# Patient Record
Sex: Female | Born: 1995 | Hispanic: No | Marital: Single | Smoking: Never smoker
Health system: Southern US, Community
[De-identification: ages and names within clinical notes are randomized; demographics above are authoritative.]

## PROBLEM LIST (undated history)

## (undated) ENCOUNTER — Emergency Department (HOSPITAL_COMMUNITY): Admission: EM | Payer: 59 | Source: Home / Self Care

## (undated) ENCOUNTER — Ambulatory Visit: Admission: EM

## (undated) DIAGNOSIS — D649 Anemia, unspecified: Secondary | ICD-10-CM

## (undated) DIAGNOSIS — J45909 Unspecified asthma, uncomplicated: Secondary | ICD-10-CM

## (undated) DIAGNOSIS — F419 Anxiety disorder, unspecified: Secondary | ICD-10-CM

## (undated) DIAGNOSIS — F32A Depression, unspecified: Secondary | ICD-10-CM

## (undated) DIAGNOSIS — F909 Attention-deficit hyperactivity disorder, unspecified type: Secondary | ICD-10-CM

## (undated) HISTORY — DX: Unspecified asthma, uncomplicated: J45.909

## (undated) HISTORY — DX: Anemia, unspecified: D64.9

## (undated) HISTORY — DX: Depression, unspecified: F32.A

## (undated) HISTORY — DX: Attention-deficit hyperactivity disorder, unspecified type: F90.9

## (undated) HISTORY — DX: Anxiety disorder, unspecified: F41.9

---
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
## 2017-01-12 ENCOUNTER — Ambulatory Visit (INDEPENDENT_AMBULATORY_CARE_PROVIDER_SITE_OTHER): Payer: BC Managed Care – PPO | Admitting: Psychology

## 2017-01-12 DIAGNOSIS — F3341 Major depressive disorder, recurrent, in partial remission: Secondary | ICD-10-CM

## 2017-02-09 ENCOUNTER — Ambulatory Visit (INDEPENDENT_AMBULATORY_CARE_PROVIDER_SITE_OTHER): Payer: BC Managed Care – PPO | Admitting: Psychology

## 2017-02-09 DIAGNOSIS — F3341 Major depressive disorder, recurrent, in partial remission: Secondary | ICD-10-CM | POA: Diagnosis not present

## 2017-02-23 ENCOUNTER — Ambulatory Visit (INDEPENDENT_AMBULATORY_CARE_PROVIDER_SITE_OTHER): Payer: BC Managed Care – PPO | Admitting: Psychology

## 2017-02-23 DIAGNOSIS — F331 Major depressive disorder, recurrent, moderate: Secondary | ICD-10-CM

## 2017-03-09 ENCOUNTER — Ambulatory Visit (INDEPENDENT_AMBULATORY_CARE_PROVIDER_SITE_OTHER): Payer: BC Managed Care – PPO | Admitting: Psychology

## 2017-03-09 DIAGNOSIS — F320 Major depressive disorder, single episode, mild: Secondary | ICD-10-CM

## 2017-03-23 ENCOUNTER — Ambulatory Visit (INDEPENDENT_AMBULATORY_CARE_PROVIDER_SITE_OTHER): Payer: BC Managed Care – PPO | Admitting: Psychology

## 2017-03-23 DIAGNOSIS — F321 Major depressive disorder, single episode, moderate: Secondary | ICD-10-CM | POA: Diagnosis not present

## 2017-04-06 ENCOUNTER — Ambulatory Visit (INDEPENDENT_AMBULATORY_CARE_PROVIDER_SITE_OTHER): Payer: BC Managed Care – PPO | Admitting: Psychology

## 2017-04-06 DIAGNOSIS — F320 Major depressive disorder, single episode, mild: Secondary | ICD-10-CM | POA: Diagnosis not present

## 2017-05-04 ENCOUNTER — Ambulatory Visit: Payer: BC Managed Care – PPO | Admitting: Psychology

## 2017-05-18 ENCOUNTER — Ambulatory Visit: Payer: BC Managed Care – PPO | Admitting: Psychology

## 2017-06-01 ENCOUNTER — Ambulatory Visit: Payer: Self-pay | Admitting: Psychology

## 2017-06-15 ENCOUNTER — Ambulatory Visit: Payer: Self-pay | Admitting: Psychology

## 2017-06-16 ENCOUNTER — Ambulatory Visit: Payer: BC Managed Care – PPO | Admitting: Psychology

## 2017-06-16 DIAGNOSIS — F331 Major depressive disorder, recurrent, moderate: Secondary | ICD-10-CM

## 2017-06-23 ENCOUNTER — Ambulatory Visit: Payer: BC Managed Care – PPO | Admitting: Psychology

## 2017-06-29 ENCOUNTER — Ambulatory Visit: Payer: Self-pay | Admitting: Psychology

## 2017-07-27 ENCOUNTER — Ambulatory Visit: Payer: Self-pay | Admitting: Psychology

## 2017-08-10 ENCOUNTER — Ambulatory Visit: Payer: Self-pay | Admitting: Psychology

## 2017-08-24 ENCOUNTER — Ambulatory Visit: Payer: Self-pay | Admitting: Psychology

## 2017-09-07 ENCOUNTER — Ambulatory Visit: Payer: Self-pay | Admitting: Psychology

## 2017-09-21 ENCOUNTER — Ambulatory Visit: Payer: Self-pay | Admitting: Psychology

## 2017-10-05 ENCOUNTER — Ambulatory Visit: Payer: Self-pay | Admitting: Psychology

## 2017-10-19 ENCOUNTER — Ambulatory Visit: Payer: Self-pay | Admitting: Psychology

## 2018-08-09 ENCOUNTER — Ambulatory Visit (HOSPITAL_COMMUNITY)
Admission: EM | Admit: 2018-08-09 | Discharge: 2018-08-09 | Disposition: A | Discharge status: Home / Self Care | Payer: BC Managed Care – PPO | Attending: Emergency Medicine | Admitting: Emergency Medicine

## 2018-08-09 ENCOUNTER — Encounter (HOSPITAL_COMMUNITY): Payer: Self-pay | Admitting: Emergency Medicine

## 2018-08-09 ENCOUNTER — Ambulatory Visit (INDEPENDENT_AMBULATORY_CARE_PROVIDER_SITE_OTHER): Payer: BC Managed Care – PPO

## 2018-08-09 DIAGNOSIS — M25562 Pain in left knee: Secondary | ICD-10-CM

## 2018-08-09 DIAGNOSIS — S8002XA Contusion of left knee, initial encounter: Secondary | ICD-10-CM

## 2018-08-09 MED ORDER — NAPROXEN 500 MG PO TABS: 500.0000 mg | ORAL_TABLET | Freq: Two times a day (BID) | ORAL | 0 refills | Status: DC
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
## 2018-08-09 NOTE — Discharge Instructions (Signed)
ice/ rest  Will need to see ortho if pain not better in 1 week
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
## 2018-08-11 ENCOUNTER — Ambulatory Visit (HOSPITAL_COMMUNITY)
Admission: EM | Admit: 2018-08-11 | Discharge: 2018-08-11 | Disposition: A | Discharge status: Home / Self Care | Payer: BC Managed Care – PPO | Attending: Family Medicine | Admitting: Family Medicine

## 2018-08-11 ENCOUNTER — Encounter (HOSPITAL_COMMUNITY): Payer: Self-pay | Admitting: Family Medicine

## 2018-08-11 DIAGNOSIS — M25562 Pain in left knee: Secondary | ICD-10-CM | POA: Diagnosis not present

## 2018-08-11 DIAGNOSIS — S161XXA Strain of muscle, fascia and tendon at neck level, initial encounter: Secondary | ICD-10-CM | POA: Diagnosis not present

## 2018-08-11 DIAGNOSIS — S8002XA Contusion of left knee, initial encounter: Secondary | ICD-10-CM

## 2018-08-11 MED ORDER — MELOXICAM 15 MG PO TABS: 15.0000 mg | ORAL_TABLET | Freq: Every day | ORAL | 1 refills | Status: DC
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
## 2018-08-11 NOTE — Discharge Instructions (Signed)
You have been referred to Dr. Teryl Lucy for follow-up on the knee contusion

## 2019-02-02 ENCOUNTER — Other Ambulatory Visit: Payer: Self-pay

## 2019-02-02 DIAGNOSIS — Z20822 Contact with and (suspected) exposure to covid-19: Secondary | ICD-10-CM

## 2019-02-06 LAB — NOVEL CORONAVIRUS, NAA: SARS-CoV-2, NAA: NOT DETECTED

## 2019-08-02 DIAGNOSIS — D649 Anemia, unspecified: Secondary | ICD-10-CM | POA: Insufficient documentation

## 2020-05-25 ENCOUNTER — Emergency Department (HOSPITAL_COMMUNITY): Payer: BC Managed Care – PPO

## 2020-05-25 ENCOUNTER — Emergency Department (HOSPITAL_COMMUNITY)
Admission: EM | Admit: 2020-05-25 | Discharge: 2020-05-26 | Disposition: A | Discharge status: Home / Self Care | Payer: BC Managed Care – PPO | Attending: Emergency Medicine | Admitting: Emergency Medicine

## 2020-05-25 ENCOUNTER — Other Ambulatory Visit: Payer: Self-pay

## 2020-05-25 DIAGNOSIS — R519 Headache, unspecified: Secondary | ICD-10-CM | POA: Diagnosis not present

## 2020-05-25 DIAGNOSIS — M542 Cervicalgia: Secondary | ICD-10-CM | POA: Insufficient documentation

## 2020-05-25 DIAGNOSIS — M25512 Pain in left shoulder: Secondary | ICD-10-CM | POA: Insufficient documentation

## 2020-05-25 DIAGNOSIS — R0789 Other chest pain: Secondary | ICD-10-CM | POA: Insufficient documentation

## 2020-05-25 MED ORDER — METHOCARBAMOL 500 MG PO TABS: 500.0000 mg | ORAL_TABLET | Freq: Two times a day (BID) | ORAL | 0 refills | Status: DC

## 2020-05-25 MED ORDER — OXYCODONE-ACETAMINOPHEN 5-325 MG PO TABS
1.0000 | ORAL_TABLET | Freq: Once | ORAL | Status: AC
  Administered 2020-05-25: 1 via ORAL
  Filled 2020-05-25: qty 1

## 2020-05-25 NOTE — Discharge Instructions (Signed)
Take the prescribed medication as directed-- sent to pharmacy for you.  Can take tylenol or motrin with this if desired. Can try using heat/ice on affected areas as well. Follow-up with your primary care doctor. Return to the ED for new or worsening symptoms.
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
## 2020-05-27 ENCOUNTER — Other Ambulatory Visit: Payer: Self-pay

## 2020-05-27 ENCOUNTER — Encounter (HOSPITAL_COMMUNITY): Payer: Self-pay | Admitting: Emergency Medicine

## 2020-05-27 ENCOUNTER — Ambulatory Visit (HOSPITAL_COMMUNITY)
Admission: EM | Admit: 2020-05-27 | Discharge: 2020-05-27 | Disposition: A | Discharge status: Home / Self Care | Payer: BC Managed Care – PPO | Attending: Urgent Care | Admitting: Urgent Care

## 2020-05-27 ENCOUNTER — Ambulatory Visit (INDEPENDENT_AMBULATORY_CARE_PROVIDER_SITE_OTHER): Payer: BC Managed Care – PPO

## 2020-05-27 DIAGNOSIS — M542 Cervicalgia: Secondary | ICD-10-CM

## 2020-05-27 DIAGNOSIS — M25561 Pain in right knee: Secondary | ICD-10-CM | POA: Diagnosis not present

## 2020-05-27 DIAGNOSIS — M545 Low back pain, unspecified: Secondary | ICD-10-CM

## 2020-05-27 DIAGNOSIS — S39012A Strain of muscle, fascia and tendon of lower back, initial encounter: Secondary | ICD-10-CM | POA: Diagnosis not present

## 2020-05-27 MED ORDER — TIZANIDINE HCL 4 MG PO TABS: 4.0000 mg | ORAL_TABLET | Freq: Three times a day (TID) | ORAL | 0 refills | Status: DC | PRN

## 2020-05-27 MED ORDER — CELECOXIB 200 MG PO CAPS: 200.0000 mg | ORAL_CAPSULE | Freq: Two times a day (BID) | ORAL | 0 refills | Status: DC
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
## 2020-05-30 ENCOUNTER — Encounter: Payer: Self-pay | Admitting: Family Medicine

## 2020-05-30 ENCOUNTER — Ambulatory Visit (INDEPENDENT_AMBULATORY_CARE_PROVIDER_SITE_OTHER): Payer: BC Managed Care – PPO | Admitting: Family Medicine

## 2020-05-30 ENCOUNTER — Other Ambulatory Visit: Payer: Self-pay

## 2020-05-30 DIAGNOSIS — S39012D Strain of muscle, fascia and tendon of lower back, subsequent encounter: Secondary | ICD-10-CM | POA: Diagnosis not present

## 2020-05-30 DIAGNOSIS — S8001XD Contusion of right knee, subsequent encounter: Secondary | ICD-10-CM

## 2020-05-30 DIAGNOSIS — M62838 Other muscle spasm: Secondary | ICD-10-CM | POA: Diagnosis not present

## 2020-05-30 DIAGNOSIS — M25561 Pain in right knee: Secondary | ICD-10-CM | POA: Diagnosis not present
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
## 2020-05-31 ENCOUNTER — Telehealth: Payer: Self-pay

## 2020-05-31 ENCOUNTER — Telehealth: Payer: Self-pay | Admitting: Family Medicine

## 2020-05-31 NOTE — Telephone Encounter (Signed)
Patient is calling to request a new return to work note. Patient was in the office in yesterday. The note states that she she can return back to work on 05/06/20.  Needs letter to state 06/06/20. Patient is also needs the note to states that she is unable to return to work until her next doctors appt on 06/19/20.  Dr. Neva Seat did not want the patient to return back to work until after her follow up on 06/19/20. Cb- [phone]

## 2020-05-31 NOTE — Telephone Encounter (Signed)
Noted and we have sent the pt a note via mychart

## 2020-05-31 NOTE — Telephone Encounter (Signed)
Please advise on the new note for pt. We do not mind correcting the note to say that pt is about to go back to work until the 06/06/20.   Please see if you want to call pt to get her scheduled for a sooner appointment than 06/19/20 on a hos f/u slot okayed per provider and Maia Breslow.

## 2020-05-31 NOTE — Telephone Encounter (Signed)
Reached pt and scheduled appt for 06/05/20 and pt stated she will be up at the office on Monday 06/03/20 to get new note with correct dates.

## 2020-05-31 NOTE — Telephone Encounter (Signed)
Sent pt a mychart message. 

## 2020-06-05 ENCOUNTER — Other Ambulatory Visit: Payer: Self-pay

## 2020-06-05 ENCOUNTER — Encounter: Payer: Self-pay | Admitting: Family Medicine

## 2020-06-05 ENCOUNTER — Ambulatory Visit: Payer: BC Managed Care – PPO | Admitting: Family Medicine

## 2020-06-05 VITALS — BP 108/67 | HR 72 | Temp 98.7°F | Ht 63.5 in | Wt 135.0 lb

## 2020-06-05 DIAGNOSIS — M25561 Pain in right knee: Secondary | ICD-10-CM | POA: Diagnosis not present

## 2020-06-05 DIAGNOSIS — S8001XD Contusion of right knee, subsequent encounter: Secondary | ICD-10-CM

## 2020-06-05 DIAGNOSIS — S39012D Strain of muscle, fascia and tendon of lower back, subsequent encounter: Secondary | ICD-10-CM

## 2020-06-05 MED ORDER — CELECOXIB 200 MG PO CAPS
200.0000 mg | ORAL_CAPSULE | Freq: Two times a day (BID) | ORAL | 0 refills | Status: DC
Start: 2020-06-05 — End: 2020-11-09

## 2020-06-05 NOTE — Patient Instructions (Addendum)
  I will order MRI of your right knee.  Okay to continue brace, crutches and only put weight on that knee if it is not causing pain.  Note provided for work for now.  Continue Celebrex as needed for now with Tylenol in addition if needed.  Continue gentle range of motion, stretches, ice, heat for back symptoms as well as muscle relaxant only if needed.  Depending on MRI can decide on orthopedic follow-up with physical therapy.Return to the clinic or go to the nearest emergency room if any of your symptoms worsen or new symptoms occur.    If you have lab work done today you will be contacted with your lab results within the next 2 weeks.  If you have not heard from [place] then please contact us. The fastest way to get your results is to register for My Chart.   IF you received an x-ray today, you will receive an invoice from [facility]. Please contact [facility] at [phone] with questions or concerns regarding your invoice.   IF you received labwork today, you will receive an invoice from [facility]. Please contact [facility] at [phone] with questions or concerns regarding your invoice.   Our billing staff will not be able to assist you with questions regarding bills from these companies.  You will be contacted with the lab results as soon as they are available. The fastest way to get your results is to activate your My Chart account. Instructions are located on the last page of this paperwork. If you have not heard from [place] regarding the results in 2 weeks, please contact this office.
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
## 2020-06-19 ENCOUNTER — Ambulatory Visit: Payer: BC Managed Care – PPO | Admitting: Family Medicine

## 2020-06-26 ENCOUNTER — Telehealth: Payer: Self-pay | Admitting: Family Medicine

## 2020-06-26 NOTE — Telephone Encounter (Signed)
I have called pt back and informed her that pt should get a call from [facility] about the MRI, however if she wanted to expedite things or check on the status than I can give her a phone number to call. She stated understanding and wrote down the number. [phone].

## 2020-06-26 NOTE — Telephone Encounter (Signed)
Pt called regarding the referral that was sent in on 11/24 for MRI. Told pt it was sent to [facility] and they will contact pt for sch. Pt stated she has not heard from anyone. Please advise.

## 2020-07-20 ENCOUNTER — Other Ambulatory Visit: Payer: BC Managed Care – PPO

## 2020-07-25 ENCOUNTER — Other Ambulatory Visit: Payer: BC Managed Care – PPO

## 2020-07-25 DIAGNOSIS — Z20822 Contact with and (suspected) exposure to covid-19: Secondary | ICD-10-CM

## 2020-07-27 ENCOUNTER — Ambulatory Visit
Admission: RE | Admit: 2020-07-27 | Discharge: 2020-07-27 | Disposition: A | Discharge status: Home / Self Care | Payer: BC Managed Care – PPO | Source: Ambulatory Visit | Attending: Family Medicine | Admitting: Family Medicine

## 2020-07-27 ENCOUNTER — Other Ambulatory Visit: Payer: Self-pay

## 2020-07-27 ENCOUNTER — Other Ambulatory Visit: Payer: BC Managed Care – PPO

## 2020-07-27 DIAGNOSIS — S8001XD Contusion of right knee, subsequent encounter: Secondary | ICD-10-CM

## 2020-07-27 DIAGNOSIS — M25561 Pain in right knee: Secondary | ICD-10-CM

## 2020-07-27 LAB — NOVEL CORONAVIRUS, NAA: SARS-CoV-2, NAA: NOT DETECTED

## 2020-07-27 LAB — SARS-COV-2, NAA 2 DAY TAT

## 2020-08-12 ENCOUNTER — Ambulatory Visit: Payer: BC Managed Care – PPO | Admitting: Family Medicine

## 2020-09-07 ENCOUNTER — Encounter (HOSPITAL_COMMUNITY): Payer: Self-pay | Admitting: *Deleted

## 2020-09-07 ENCOUNTER — Ambulatory Visit (INDEPENDENT_AMBULATORY_CARE_PROVIDER_SITE_OTHER): Payer: BC Managed Care – PPO

## 2020-09-07 ENCOUNTER — Other Ambulatory Visit: Payer: Self-pay

## 2020-09-07 ENCOUNTER — Ambulatory Visit (HOSPITAL_COMMUNITY)
Admission: EM | Admit: 2020-09-07 | Discharge: 2020-09-07 | Disposition: A | Discharge status: Home / Self Care | Payer: BC Managed Care – PPO | Attending: Family Medicine | Admitting: Family Medicine

## 2020-09-07 DIAGNOSIS — M79645 Pain in left finger(s): Secondary | ICD-10-CM | POA: Diagnosis not present

## 2020-09-07 DIAGNOSIS — S60052A Contusion of left little finger without damage to nail, initial encounter: Secondary | ICD-10-CM

## 2020-09-07 NOTE — Discharge Instructions (Signed)
Take Tylenol or ibuprofen as needed.  Rest and elevate your hand.  Apply ice packs 2-3 times a day for up to 20 minutes each.  Wear the finger splint as needed for comfort.    Follow up with an orthopedic hand specialist if your symptoms are not improving.
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
## 2020-11-09 ENCOUNTER — Other Ambulatory Visit: Payer: Self-pay

## 2020-11-09 ENCOUNTER — Ambulatory Visit (HOSPITAL_COMMUNITY)
Admission: EM | Admit: 2020-11-09 | Discharge: 2020-11-09 | Disposition: A | Discharge status: Home / Self Care | Payer: BC Managed Care – PPO | Attending: Family Medicine | Admitting: Family Medicine

## 2020-11-09 ENCOUNTER — Encounter (HOSPITAL_COMMUNITY): Payer: Self-pay

## 2020-11-09 DIAGNOSIS — M25512 Pain in left shoulder: Secondary | ICD-10-CM

## 2020-11-09 DIAGNOSIS — S39012A Strain of muscle, fascia and tendon of lower back, initial encounter: Secondary | ICD-10-CM

## 2020-11-09 DIAGNOSIS — S161XXA Strain of muscle, fascia and tendon at neck level, initial encounter: Secondary | ICD-10-CM

## 2020-11-09 MED ORDER — TIZANIDINE HCL 4 MG PO TABS
4.0000 mg | ORAL_TABLET | Freq: Three times a day (TID) | ORAL | 0 refills | Status: DC | PRN
Start: 2020-11-09 — End: 2022-03-11

## 2020-11-09 MED ORDER — CELECOXIB 200 MG PO CAPS
200.0000 mg | ORAL_CAPSULE | Freq: Two times a day (BID) | ORAL | 0 refills | Status: DC
Start: 2020-11-09 — End: 2023-09-23
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
## 2020-11-11 ENCOUNTER — Telehealth: Payer: Self-pay
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
## 2020-11-12 NOTE — Telephone Encounter (Signed)
Unable to LM due to VM being full, sent pt a mychart message asking her to call the office to rs.

## 2020-12-12 ENCOUNTER — Other Ambulatory Visit: Payer: Self-pay

## 2020-12-12 ENCOUNTER — Telehealth (INDEPENDENT_AMBULATORY_CARE_PROVIDER_SITE_OTHER): Payer: BC Managed Care – PPO | Admitting: Family Medicine

## 2020-12-12 ENCOUNTER — Ambulatory Visit: Payer: BC Managed Care – PPO | Attending: Critical Care Medicine

## 2020-12-12 DIAGNOSIS — R059 Cough, unspecified: Secondary | ICD-10-CM

## 2020-12-12 DIAGNOSIS — Z20822 Contact with and (suspected) exposure to covid-19: Secondary | ICD-10-CM

## 2020-12-12 DIAGNOSIS — R0981 Nasal congestion: Secondary | ICD-10-CM | POA: Diagnosis not present

## 2020-12-12 DIAGNOSIS — B349 Viral infection, unspecified: Secondary | ICD-10-CM | POA: Diagnosis not present

## 2020-12-12 MED ORDER — BENZONATATE 100 MG PO CAPS: 100.0000 mg | ORAL_CAPSULE | Freq: Three times a day (TID) | ORAL | 0 refills | Status: DC | PRN

## 2020-12-12 NOTE — Progress Notes (Signed)
Virtual Visit via audio Note  I connected with Caroline Grant on 12/12/20 at 3:33 PM by a video enabled telemedicine application, but screen froze- changed to audio after 1 min and verified that I am speaking with the correct person using two identifiers.  Patient location: in car with Girlfriend - on airpods. My location: [place] office.    I discussed the limitations, risks, security and privacy concerns of performing an evaluation and management service by telephone and the availability of in person appointments. I also discussed with the patient that there may be a patient responsible charge related to this service. The patient expressed understanding and agreed to proceed, consent obtained  Chief complaint:  Chief Complaint  Patient presents with  . Nasal Congestion    Pt reports has fever, sinus drainage, congestion, nausea and vomiting, hard to breath through nose, swollen glands. Pt notes sxs started this morning      History of Present Illness: Caroline Grant is a 25 y.o. female  Nasal Congestion: Started earlier this morning. Works at [employer]. Increased heat at warehouse. Used icebath to cool down.  Started to have nasal congestion this am, sore throat and cough, with some posttussive emesis.  No known fever. Has not checked temp.  Has not performed covid test, no known sick contacts.  Tx: cough syrup. Able to drink water, trying to increase water intake.  No syncopal episodes. Min dyspnea - more of nasal congestion. Has received moderna Covid vaccine - March of this year.  Has at home test, but plans on pharmacy    Plans to follow up for anemia, prediabetes, other chronic medical conditions.     Patient Active Problem List   Diagnosis Date Noted  . Anemia 08/02/2019   Past Medical History:  Diagnosis Date  . ADHD (attention deficit hyperactivity disorder)   . Anemia    Phreesia 05/29/2020  . Anxiety    Phreesia 05/29/2020  . Asthma    Phreesia  05/29/2020  . Depression    Phreesia 05/29/2020   No past surgical history on file. Allergies  Allergen Reactions  . Naproxen Nausea And Vomiting  . Meloxicam Nausea And Vomiting  . Nickel Hives and Rash   Prior to Admission medications   Medication Sig Start Date End Date Taking? Authorizing Provider  celecoxib (CELEBREX) 200 MG capsule Take 1 capsule (200 mg total) by mouth 2 (two) times daily. 11/09/20  Yes Bing Neighbors, FNP  methocarbamol (ROBAXIN) 500 MG tablet Take 1 tablet (500 mg total) by mouth 2 (two) times daily. 05/25/20  Yes Garlon Hatchet, PA-C  methylphenidate (METADATE CD) 20 MG CR capsule Take 20 mg by mouth every morning.    Yes [provider]  tiZANidine (ZANAFLEX) 4 MG tablet Take 1 tablet (4 mg total) by mouth every 8 (eight) hours as needed. 11/09/20  Yes Bing Neighbors, FNP  Vitamin D, Ergocalciferol, (DRISDOL) 1.25 MG (50000 UNIT) CAPS capsule Take by mouth. 05/09/20  Yes [provider]   Social History   Socioeconomic History  . Marital status: Single    Spouse name: Not on file  . Number of children: Not on file  . Years of education: Not on file  . Highest education level: Not on file  Occupational History  . Not on file  Tobacco Use  . Smoking status: Never Smoker  . Smokeless tobacco: Never Used  Vaping Use  . Vaping Use: Every day  Substance and Sexual Activity  . Alcohol use: Yes  Comment: occasionally  . Drug use: Yes    Types: Marijuana    Comment: daily  . Sexual activity: Yes    Comment: homosexual  Other Topics Concern  . Not on file  Social History Narrative  . Not on file   Social Determinants of Health   Financial Resource Strain: Not on file  Food Insecurity: Not on file  Transportation Needs: Not on file  Physical Activity: Not on file  Stress: Not on file  Social Connections: Not on file  Intimate Partner Violence: Not on file    Observations/Objective: There were no vitals filed for  this visit. Nontoxic appearance on brief video visit, speaking full sentences without apparent respiratory distress.  Appropriate responses.  All questions were answered with understanding plan expressed  Assessment and Plan: Cough - Plan: benzonatate (TESSALON) 100 MG capsule  Nasal congestion - Plan: benzonatate (TESSALON) 100 MG capsule  Viral illness  Suspected viral infection, differential includes COVID-19 infection.  Continue/isolation precautions discussed until negative testing.  If positive masking and isolation precautions discussed.  Phone number provided for in person testing today or home testing depending on the requirements from her employer.  Note/letter was provided for her employer.  Mucinex or Mucinex DM discussed, Tessalon Perles if needed.  Symptomatic care with fluids, rest and ER/RTC precautions discussed  Follow Up Instructions: As needed.    I discussed the assessment and treatment plan with the patient. The patient was provided an opportunity to ask questions and all were answered. The patient agreed with the plan and demonstrated an understanding of the instructions.   The patient was advised to call back or seek an in-person evaluation if the symptoms worsen or if the condition fails to improve as anticipated.  I provided 20 minutes of non-face-to-face time during this encounter.   Shade Flood, MD

## 2020-12-12 NOTE — Patient Instructions (Signed)
Mucinex or Mucinex DM as needed for cough, Tessalon Perles sent to your pharmacy.  Make sure to drink plenty of fluids, rest.  If you are having increasing shortness of breath at rest, unable to drink fluids, or feeling that you are going to faint or pass out, be seen through urgent care or ER.  I recommend masking at all times and avoiding others until you have a negative COVID test.  If positive I did give you information on the note for your work as far as return to work.  Take care.  Let me know if there are questions.

## 2020-12-13 LAB — NOVEL CORONAVIRUS, NAA: SARS-CoV-2, NAA: NOT DETECTED

## 2020-12-13 LAB — SARS-COV-2, NAA 2 DAY TAT

## 2021-03-03 ENCOUNTER — Emergency Department (HOSPITAL_COMMUNITY): Payer: BC Managed Care – PPO

## 2021-03-03 ENCOUNTER — Other Ambulatory Visit: Payer: Self-pay

## 2021-03-03 ENCOUNTER — Emergency Department (HOSPITAL_COMMUNITY)
Admission: EM | Admit: 2021-03-03 | Discharge: 2021-03-04 | Disposition: A | Discharge status: Home / Self Care | Payer: BC Managed Care – PPO | Attending: Emergency Medicine | Admitting: Emergency Medicine

## 2021-03-03 ENCOUNTER — Encounter (HOSPITAL_COMMUNITY): Payer: Self-pay | Admitting: Emergency Medicine

## 2021-03-03 DIAGNOSIS — F32A Depression, unspecified: Secondary | ICD-10-CM | POA: Insufficient documentation

## 2021-03-03 DIAGNOSIS — M79644 Pain in right finger(s): Secondary | ICD-10-CM | POA: Insufficient documentation

## 2021-03-03 DIAGNOSIS — R519 Headache, unspecified: Secondary | ICD-10-CM | POA: Insufficient documentation

## 2021-03-03 DIAGNOSIS — Z79899 Other long term (current) drug therapy: Secondary | ICD-10-CM | POA: Diagnosis not present

## 2021-03-03 DIAGNOSIS — Y900 Blood alcohol level of less than 20 mg/100 ml: Secondary | ICD-10-CM | POA: Insufficient documentation

## 2021-03-03 DIAGNOSIS — Z20822 Contact with and (suspected) exposure to covid-19: Secondary | ICD-10-CM | POA: Diagnosis not present

## 2021-03-03 DIAGNOSIS — S66114A Strain of flexor muscle, fascia and tendon of right ring finger at wrist and hand level, initial encounter: Secondary | ICD-10-CM | POA: Diagnosis not present

## 2021-03-03 DIAGNOSIS — Z23 Encounter for immunization: Secondary | ICD-10-CM | POA: Insufficient documentation

## 2021-03-03 DIAGNOSIS — R1110 Vomiting, unspecified: Secondary | ICD-10-CM | POA: Insufficient documentation

## 2021-03-03 DIAGNOSIS — M79643 Pain in unspecified hand: Secondary | ICD-10-CM

## 2021-03-03 DIAGNOSIS — S6991XA Unspecified injury of right wrist, hand and finger(s), initial encounter: Secondary | ICD-10-CM | POA: Diagnosis present

## 2021-03-03 LAB — COMPREHENSIVE METABOLIC PANEL
ALT: 15 U/L (ref 0–44)
AST: 20 U/L (ref 15–41)
Albumin: 4 g/dL (ref 3.5–5.0)
Alkaline Phosphatase: 33 U/L — ABNORMAL LOW (ref 38–126)
Anion gap: 12 (ref 5–15)
BUN: 10 mg/dL (ref 6–20)
CO2: 24 mmol/L (ref 22–32)
Calcium: 9.3 mg/dL (ref 8.9–10.3)
Chloride: 101 mmol/L (ref 98–111)
Creatinine, Ser: 0.88 mg/dL (ref 0.44–1.00)
GFR, Estimated: >60 mL/min (ref >60)
Glucose, Bld: 92 mg/dL (ref 70–99)
Potassium: 3.4 mmol/L — ABNORMAL LOW (ref 3.5–5.1)
Sodium: 137 mmol/L (ref 135–145)
Total Bilirubin: 0.8 mg/dL (ref 0.3–1.2)
Total Protein: 6.6 g/dL (ref 6.5–8.1)

## 2021-03-03 LAB — CBC WITH DIFFERENTIAL/PLATELET
Abs Immature Granulocytes: 0.01 10*3/uL (ref 0.00–0.07)
Basophils Absolute: 0 10*3/uL (ref 0.0–0.1)
Basophils Relative: 1 %
Eosinophils Absolute: 0 10*3/uL (ref 0.0–0.5)
Eosinophils Relative: 0 %
HCT: 37.4 % (ref 36.0–46.0)
Hemoglobin: 12.2 g/dL (ref 12.0–15.0)
Immature Granulocytes: 0 %
Lymphocytes Relative: 21 %
Lymphs Abs: 1.3 10*3/uL (ref 0.7–4.0)
MCH: 29.8 pg (ref 26.0–34.0)
MCHC: 32.6 g/dL (ref 30.0–36.0)
MCV: 91.2 fL (ref 80.0–100.0)
Monocytes Absolute: 0.5 10*3/uL (ref 0.1–1.0)
Monocytes Relative: 8 %
Neutro Abs: 4.3 10*3/uL (ref 1.7–7.7)
Neutrophils Relative %: 70 %
Platelets: 389 10*3/uL (ref 150–400)
RBC: 4.1 MIL/uL (ref 3.87–5.11)
RDW: 16.6 % — ABNORMAL HIGH (ref 11.5–15.5)
WBC: 6.1 10*3/uL (ref 4.0–10.5)
nRBC: 0 % (ref 0.0–0.2)

## 2021-03-03 LAB — I-STAT BETA HCG BLOOD, ED (MC, WL, AP ONLY): I-stat hCG, quantitative: <5 m[IU]/mL (ref <5)

## 2021-03-03 LAB — RAPID URINE DRUG SCREEN, HOSP PERFORMED
Amphetamines: NOT DETECTED
Barbiturates: NOT DETECTED
Benzodiazepines: NOT DETECTED
Cocaine: NOT DETECTED
Opiates: NOT DETECTED
Tetrahydrocannabinol: POSITIVE — AB

## 2021-03-03 LAB — RESP PANEL BY RT-PCR (FLU A&B, COVID) ARPGX2
Influenza A by PCR: NEGATIVE
Influenza B by PCR: NEGATIVE
SARS Coronavirus 2 by RT PCR: NEGATIVE

## 2021-03-03 LAB — ETHANOL: Alcohol, Ethyl (B): <10 mg/dL (ref <10)

## 2021-03-03 LAB — ACETAMINOPHEN LEVEL: Acetaminophen (Tylenol), Serum: <10 ug/mL — ABNORMAL LOW (ref 10–30)

## 2021-03-03 LAB — SALICYLATE LEVEL: Salicylate Lvl: <7 mg/dL — ABNORMAL LOW (ref 7.0–30.0)

## 2021-03-03 MED ORDER — TETANUS-DIPHTH-ACELL PERTUSSIS 5-2.5-18.5 LF-MCG/0.5 IM SUSY
0.5000 mL | PREFILLED_SYRINGE | Freq: Once | INTRAMUSCULAR | Status: AC
  Administered 2021-03-04: 0.5 mL via INTRAMUSCULAR
  Filled 2021-03-03: qty 0.5

## 2021-03-03 NOTE — ED Notes (Signed)
Pt friend [place] came to check on patient. Explained pt cant have visitors at this time.
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
## 2021-03-03 NOTE — ED Notes (Signed)
Patient at triage

## 2021-03-04 NOTE — Progress Notes (Signed)
Orthopedic Tech Progress Note Patient Details:  Caroline Grant August 14, 1995 [id number]  Ortho Devices Type of Ortho Device: Finger splint Ortho Device/Splint Location: rue 5th finger. Ortho Device/Splint Interventions: Ordered, Application, Adjustment   Post Interventions Patient Tolerated: Well Instructions Provided: Adjustment of device, Care of device  Trinna Post 03/04/2021, 3:54 AM
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
## 2021-03-04 NOTE — ED Notes (Signed)
Ortho called for splint application. 

## 2021-03-04 NOTE — Discharge Instructions (Addendum)
Follow up with the resources provided for counseling.   If you experience any feelings of wanting to hurt yourself at any time, please return to the emergency department for further support and treatment.

## 2021-03-04 NOTE — ED Notes (Signed)
Pt is cleared by PA and will be discharged home; personal belongings returned to pt

## 2021-11-20 ENCOUNTER — Ambulatory Visit: Payer: BC Managed Care – PPO | Admitting: Family Medicine
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
## 2021-11-26 ENCOUNTER — Ambulatory Visit: Payer: BC Managed Care – PPO | Admitting: Family Medicine

## 2021-12-03 ENCOUNTER — Ambulatory Visit: Payer: BC Managed Care – PPO | Admitting: Family Medicine

## 2021-12-03 VITALS — BP 116/60 | HR 82 | Temp 98.2°F | Resp 16 | Ht 66.0 in | Wt 121.6 lb

## 2021-12-03 DIAGNOSIS — M25561 Pain in right knee: Secondary | ICD-10-CM

## 2021-12-03 DIAGNOSIS — M222X2 Patellofemoral disorders, left knee: Secondary | ICD-10-CM | POA: Diagnosis not present

## 2021-12-03 DIAGNOSIS — M25562 Pain in left knee: Secondary | ICD-10-CM

## 2021-12-03 DIAGNOSIS — M222X1 Patellofemoral disorders, right knee: Secondary | ICD-10-CM | POA: Diagnosis not present
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
## 2021-12-03 NOTE — Patient Instructions (Addendum)
If you are still having difficulty with your knee, I recommend follow up with your orthopaedist to decide next step.  You may have some patellofemoral pain based on my exam today, but follow up with ortho to discuss further. Let me know if they need a referral.   Patellofemoral Pain Syndrome  Patellofemoral pain syndrome is a condition in which the tissue (cartilage) on the underside of the kneecap (patella) softens or breaks down. This causes pain in the front of the knee. The condition is also called runner's knee or chondromalacia patella. Patellofemoral pain syndrome is most common in young adults who are active in sports. The knee is the largest joint in the body. The patella covers the front of the knee and is attached to muscles above and below the knee. The underside of the patella is covered with a smooth type of cartilage (synovium). The smooth surface helps the patella glide easily when you move your knee. Patellofemoral pain syndrome causes swelling in the joint linings and bone surfaces in the knee. What are the causes? This condition may be caused by: Overuse of the knee. Poor alignment of your knee joints. Weak leg muscles. A direct hit to your kneecap. What increases the risk? You are more likely to develop this condition if: You do a lot of activities that can wear down your kneecap. These include: Running. Squatting. Climbing stairs. You start a new physical activity or exercise program. You wear shoes that do not fit well. You do not have good leg strength. You are overweight. What are the signs or symptoms? The main symptom of this condition is knee pain. This may feel like a dull, aching pain underneath your patella, in the front of your knee. There may be a popping or cracking sound when you move your knee. Pain may get worse with: Exercise. Climbing stairs. Running. Jumping. Squatting. Kneeling. Sitting for a long time. Moving or pushing on your patella. How is  this diagnosed? This condition may be diagnosed based on: Your symptoms and medical history. You may be asked about your recent physical activities and which ones cause knee pain. A physical exam. This may include: Moving your patella back and forth. Checking your range of knee motion. Having you squat or jump to see if you have pain. Checking the strength of your leg muscles. Imaging tests to confirm the diagnosis. These may include an MRI of your knee. How is this treated? This condition may be treated at home with rest, ice, compression, and elevation (RICE).  Other treatments may include: NSAIDs, such as ibuprofen. Physical therapy to stretch and strengthen your leg muscles. Shoe inserts (orthotics) to take stress off your knee. A knee brace or knee support. Adhesive tapes to the skin. Surgery to remove damaged cartilage or move the patella to a better position. This is rare. Follow these instructions at home: If you have a brace: Wear the brace as told by your health care provider. Remove it only as told by your health care provider. Loosen the brace if your toes tingle, become numb, or turn cold and blue. Keep the brace clean. If the brace is not waterproof: Do not let it get wet. Cover it with a watertight covering when you take a bath or a shower. Managing pain, stiffness, and swelling  If directed, put ice on the painful area. To do this: If you have a removable brace, remove it as told by your health care provider. Put ice in a plastic bag. Place  a towel between your skin and the bag. Leave the ice on for 20 minutes, 2-3 times a day. Remove the ice if your skin turns bright red. This is very important. If you cannot feel pain, heat, or cold, you have a greater risk of damage to the area. Move your toes often to reduce stiffness and swelling. Raise (elevate) the injured area above the level of your heart while you are sitting or lying down. Activity Rest your  knee. Avoid activities that cause knee pain. Perform stretching and strengthening exercises as told by your health care provider or physical therapist. Return to your normal activities as told by your health care provider. Ask your health care provider what activities are safe for you. General instructions Take over-the-counter and prescription medicines only as told by your health care provider. Use splints, braces, knee supports, or walking aids as directed by your health care provider. Do not use any products that contain nicotine or tobacco, such as cigarettes, e-cigarettes, and chewing tobacco. These can delay healing. If you need help quitting, ask your health care provider. Keep all follow-up visits. This is important. Contact a health care provider if: Your symptoms get worse. You are not improving with home care. Summary Patellofemoral pain syndrome is a condition in which the tissue (cartilage) on the underside of the kneecap (patella) softens or breaks down. This condition causes swelling in the joint linings and bone surfaces in the knee. This leads to pain in the front of the knee. This condition may be treated at home with rest, ice, compression, and elevation (RICE). Use splints, braces, knee supports, or walking aids as directed by your health care provider. This information is not intended to replace advice given to you by your health care provider. Make sure you discuss any questions you have with your health care provider. Document Revised: 12/13/2019 Document Reviewed: 12/13/2019 Elsevier Patient Education  2023 [employer].

## 2021-12-04 ENCOUNTER — Encounter: Payer: Self-pay | Admitting: Family Medicine

## 2021-12-11 ENCOUNTER — Encounter (HOSPITAL_COMMUNITY): Payer: Self-pay

## 2021-12-11 ENCOUNTER — Ambulatory Visit (HOSPITAL_COMMUNITY)
Admission: EM | Admit: 2021-12-11 | Discharge: 2021-12-11 | Disposition: A | Discharge status: Home / Self Care | Payer: BC Managed Care – PPO | Attending: Emergency Medicine | Admitting: Emergency Medicine

## 2021-12-11 ENCOUNTER — Ambulatory Visit (INDEPENDENT_AMBULATORY_CARE_PROVIDER_SITE_OTHER): Payer: BC Managed Care – PPO

## 2021-12-11 DIAGNOSIS — S40022A Contusion of left upper arm, initial encounter: Secondary | ICD-10-CM | POA: Diagnosis not present

## 2021-12-11 DIAGNOSIS — R519 Headache, unspecified: Secondary | ICD-10-CM

## 2021-12-11 DIAGNOSIS — M25532 Pain in left wrist: Secondary | ICD-10-CM | POA: Diagnosis not present

## 2021-12-11 MED ORDER — IBUPROFEN 800 MG PO TABS: 800.0000 mg | ORAL_TABLET | Freq: Three times a day (TID) | ORAL | 0 refills | Status: DC

## 2021-12-11 NOTE — ED Triage Notes (Signed)
Pt c/o headaches off and on for 1.5wks.  Pt states tripped going up the steps and hit her lt wrist. Bruising and tender to touch noted.

## 2021-12-11 NOTE — Discharge Instructions (Signed)
Your x-ray was negative for injury to the bone however as bruising is present this is most likely the cause of your discomfort, this should progressively get better with time  Begin use of ibuprofen 800 mg 3 times a day for the next 5 days to help reduce swelling, this medication will also help to relieve the pressure from your headache and minimize this symptom  You may use Tylenol 500 to 1000 mg every 6 hours in addition to the ibuprofen to maximize your pain relief  Ensure you are getting adequate rest as fatigue will worsen your headache  Ensure that you are drinking adequate amounts of water as dehydration will worsen your headache, limit salt intake  If you find that you are getting reoccurring headaches please follow-up with your primary doctor for evaluation and management of migraines  Please ice or heat over your arm continue 15-minute intervals to help  reduce swelling  Your arm has been wrapped in compression, you may use as needed, this is to provide stability and support and reduce your pain  You may follow-up with this urgent care as needed

## 2021-12-11 NOTE — ED Provider Notes (Signed)
[facility]    CSN: [id number] Arrival date & time: 12/11/21  1539      History   Chief Complaint Chief Complaint  Patient presents with   Headache   Fall    HPI Caroline Grant is a 26 y.o. female.   Presents today intermittent right-sided headaches for 1-1/2 weeks.  Associated photophobia.  Has not attempted treatment of symptoms.  Time of headache fluctuates with day and is independent of diet, sleep.  Denies dizziness, lightheadedness, phonophobia, nausea, vomiting, visual disturbance.    Patient presents with left forearm pain beginning this morning after fall.  Endorses that she tripped and landed onto the left arm causing a bruise .  Swelling and tenderness is present at the site of the bruise.  No involvement of the left wrist.  Patient endorses with movement of the left reaching experiencing pain into the left forearm.  Has not attempted treatment.  Denies numbness or tingling.   Past Medical History:  Diagnosis Date   ADHD (attention deficit hyperactivity disorder)    Anemia    Phreesia 05/29/2020   Anxiety    Phreesia 05/29/2020   Asthma    Phreesia 05/29/2020   Depression    Phreesia 05/29/2020    Patient Active Problem List   Diagnosis Date Noted   Anemia 08/02/2019    History reviewed. No pertinent surgical history.  OB History   No obstetric history on file.      Home Medications    Prior to Admission medications   Medication Sig Start Date End Date Taking? Authorizing Provider  benzonatate (TESSALON) 100 MG capsule Take 1 capsule (100 mg total) by mouth 3 (three) times daily as needed for cough. Patient not taking: Reported on 12/03/2021 12/12/20   Shade FloodGreene, Jeffrey R, MD  celecoxib (CELEBREX) 200 MG capsule Take 1 capsule (200 mg total) by mouth 2 (two) times daily. Patient not taking: Reported on 12/03/2021 11/09/20   Bing NeighborsHarris, Kimberly S, FNP  methocarbamol (ROBAXIN) 500 MG tablet Take 1 tablet (500 mg total) by mouth 2 (two) times  daily. Patient not taking: Reported on 12/03/2021 05/25/20   Garlon HatchetSanders, Lisa M, PA-C  methylphenidate (METADATE CD) 20 MG CR capsule Take 20 mg by mouth every morning.  Patient not taking: Reported on 12/03/2021    [provider]  tiZANidine (ZANAFLEX) 4 MG tablet Take 1 tablet (4 mg total) by mouth every 8 (eight) hours as needed. Patient not taking: Reported on 12/03/2021 11/09/20   Bing NeighborsHarris, Kimberly S, FNP  Vitamin D, Ergocalciferol, (DRISDOL) 1.25 MG (50000 UNIT) CAPS capsule Take by mouth. Patient not taking: Reported on 12/03/2021 05/09/20   [provider]    Family History Family History  Problem Relation Age of Onset   Healthy Mother    Healthy Father     Social History Social History   Tobacco Use   Smoking status: Never   Smokeless tobacco: Never  Vaping Use   Vaping Use: Every day  Substance Use Topics   Alcohol use: Yes    Comment: occasionally   Drug use: Yes    Types: Marijuana    Comment: daily     Allergies   Naproxen, Meloxicam, and Nickel   Review of Systems Review of Systems  Constitutional: Negative.   Cardiovascular: Negative.   Gastrointestinal: Negative.   Musculoskeletal:  Positive for myalgias. Negative for arthralgias, back pain, gait problem, joint swelling, neck pain and neck stiffness.  Skin: Negative.   Neurological:  Positive for headaches. Negative  for dizziness, tremors, seizures, syncope, facial asymmetry, speech difficulty, weakness, light-headedness and numbness.    Physical Exam Triage Vital Signs ED Triage Vitals [12/11/21 1650]  Enc Vitals Group     BP 121/80     Pulse Rate 86     Resp 18     Temp 98 F (36.7 C)     Temp Source Oral     SpO2 100 %     Weight      Height      Head Circumference      Peak Flow      Pain Score 7     Pain Loc      Pain Edu?      Excl. in GC?    No data found.  Updated Vital Signs BP 121/80 (BP Location: Left Arm)   Pulse 86   Temp 98 F (36.7 C) (Oral)   Resp 18    LMP 12/06/2021   SpO2 100%   Visual Acuity Right Eye Distance:   Left Eye Distance:   Bilateral Distance:    Right Eye Near:   Left Eye Near:    Bilateral Near:     Physical Exam Constitutional:      Appearance: Normal appearance. She is well-developed.  HENT:     Head: Normocephalic.  Eyes:     Extraocular Movements: Extraocular movements intact.  Pulmonary:     Effort: Pulmonary effort is normal.  Musculoskeletal:     Comments: 2 x 3 cm circular contusion noted to the posterior aspect of the left forearm with mild to moderate swelling and tenderness to palpation, sensation is intact, no involvement of the wrist, range of motion of wrist intact, 2+ radial pulse  Skin:    General: Skin is warm and dry.  Neurological:     General: No focal deficit present.     Mental Status: She is alert and oriented to person, place, and time. Mental status is at baseline.  Psychiatric:        Mood and Affect: Mood normal.        Behavior: Behavior normal.     UC Treatments / Results  Labs (all labs ordered are listed, but only abnormal results are displayed) Labs Reviewed - No data to display  EKG   Radiology No results found.  Procedures Procedures (including critical care time)  Medications Ordered in UC Medications - No data to display  Initial Impression / Assessment and Plan / UC Course  I have reviewed the triage vital signs and the nursing notes.  Pertinent labs & imaging results that were available during my care of the patient were reviewed by me and considered in my medical decision making (see chart for details).  Contusion of the left upper extremity, initial encounter Bad headache  X-ray negative for injury to the bone, discussed findings with patient, compression wrap placed for comfort, may continue use as needed, ibuprofen 800 mg prescribed for outpatient management, may use Tylenol in addition, recommend ice and heat over the affected area to help reduce  swelling, may follow-up for reevaluation of site as needed  No abnormalities noted on the neurological exam, prescribed ibuprofen for contusion which will help manage headache, advised increase fluid intake to prevent dehydration and adequate rest to not exacerbate symptoms, discussed with patient that if headaches continue to persist or recur she may follow-up with her primary doctor for evaluation for migraines, may follow-up with his urgent care as needed, work note given Final Clinical  Impressions(s) / UC Diagnoses   Final diagnoses:  None   Discharge Instructions   None    ED Prescriptions   None    PDMP not reviewed this encounter.   Valinda Hoar, NP 12/11/21 1826

## 2022-03-11 ENCOUNTER — Ambulatory Visit: Payer: BC Managed Care – PPO | Admitting: Family

## 2022-03-11 ENCOUNTER — Encounter: Payer: Self-pay | Admitting: Family

## 2022-03-11 VITALS — BP 105/69 | HR 79 | Temp 98.3°F | Ht 66.0 in | Wt 120.6 lb

## 2022-03-11 DIAGNOSIS — R21 Rash and other nonspecific skin eruption: Secondary | ICD-10-CM | POA: Diagnosis not present

## 2022-03-11 MED ORDER — TRIAMCINOLONE ACETONIDE 0.1 % EX CREA: 1.0000 | TOPICAL_CREAM | Freq: Two times a day (BID) | CUTANEOUS | 0 refills | Status: DC

## 2022-03-11 MED ORDER — METHYLPREDNISOLONE ACETATE 80 MG/ML IJ SUSP
80.0000 mg | Freq: Once | INTRAMUSCULAR | Status: AC
Start: 2022-03-11 — End: 2022-03-11
  Administered 2022-03-11: 80 mg via INTRAMUSCULAR

## 2022-03-11 MED ORDER — HYDROXYZINE HCL 10 MG PO TABS: 10.0000 mg | ORAL_TABLET | Freq: Three times a day (TID) | ORAL | 0 refills | Status: DC | PRN

## 2022-03-11 NOTE — Patient Instructions (Signed)
It was very nice to see you today!   We gave you a steroid shot today to help relieve your rash symptoms and keep the rash from continuing to spread. I also sent over Hydroxyzine tablets to help with the itching, they might cause a little drowsiness, take at home first to see how you react. Also have sent Triamcinolone cream to apply to the itchier areas. See below for other suggestions: Poison Ivy/Oak/Sumak Treatment  Apply cool, wet compresses of plain water OR topical Burow's Solution (aluminum acetate) or Domeboro several times daily for 30 minutes, allowing water to evaporate slowly.    As an alternative, or in conjunction with wet compresses, use calamine lotion. Calamine lotion containing pramoxine (an ingredient that numbs irritated skin and helps with itching) is Aveeno Anti-Itch Lotion.    May also take oral antihistamines (Claritin, Allegra, Zyrtec) daily to help with itching.

## 2022-03-11 NOTE — Progress Notes (Signed)
Patient ID: Caroline Grant, female    DOB: March 23, 1996, 26 y.o.   MRN: [id number]  Chief Complaint  Patient presents with   Insect Bite    Pt c/o bug bites and rashes on back, under breast, stomach and privates. Noticed 4 days ago. Has tried aleve. Pt states she was out of town when this occurred. Redness, itchy and dryness.     HPI: Skin rash:  first noticed 4 days ago after visiting her home in [place]. Reports being outside and could feel bugs biting her all over. Rash is on her chest, bilateral arms, back and abdomen. Reports hive-like bumps, not draining, itchy. States the rash has spread a little more each day she has been home.  Assessment & Plan:  1. Skin rash appears as poison oak type of rash. Advised pt she may have come in contact with plant or oil of plant when outside unknowingly. Advised on OTC topical tx, sending Atarax & steroid cream, and given steroid injection, advised on use & SE of all meds.  - triamcinolone cream (KENALOG) 0.1 %; Apply 1 Application topically 2 (two) times daily.  Dispense: 30 g; Refill: 0 - methylPREDNISolone acetate (DEPO-MEDROL) injection 80 mg - hydrOXYzine (ATARAX) 10 MG tablet; Take 1 tablet (10 mg total) by mouth 3 (three) times daily as needed for itching.  Dispense: 30 tablet; Refill: 0  Subjective:    Outpatient Medications Prior to Visit  Medication Sig Dispense Refill   Vitamin D, Ergocalciferol, (DRISDOL) 1.25 MG (50000 UNIT) CAPS capsule Take by mouth.     methocarbamol (ROBAXIN) 500 MG tablet Take 1 tablet (500 mg total) by mouth 2 (two) times daily. 20 tablet 0   celecoxib (CELEBREX) 200 MG capsule Take 1 capsule (200 mg total) by mouth 2 (two) times daily. (Patient not taking: Reported on 03/11/2022) 30 capsule 0   methylphenidate (METADATE CD) 20 MG CR capsule Take 20 mg by mouth every morning.  (Patient not taking: Reported on 03/11/2022)     benzonatate (TESSALON) 100 MG capsule Take 1 capsule (100 mg total) by mouth 3 (three)  times daily as needed for cough. (Patient not taking: Reported on 03/11/2022) 20 capsule 0   ibuprofen (ADVIL) 800 MG tablet Take 1 tablet (800 mg total) by mouth 3 (three) times daily. (Patient not taking: Reported on 03/11/2022) 40 tablet 0   tiZANidine (ZANAFLEX) 4 MG tablet Take 1 tablet (4 mg total) by mouth every 8 (eight) hours as needed. (Patient not taking: Reported on 03/11/2022) 30 tablet 0   No facility-administered medications prior to visit.   Past Medical History:  Diagnosis Date   ADHD (attention deficit hyperactivity disorder)    Anemia    Phreesia 05/29/2020   Anxiety    Phreesia 05/29/2020   Asthma    Phreesia 05/29/2020   Depression    Phreesia 05/29/2020   History reviewed. No pertinent surgical history. Allergies  Allergen Reactions   Naproxen Nausea And Vomiting   Meloxicam Nausea And Vomiting   Nickel Hives and Rash      Objective:    Physical Exam Vitals and nursing note reviewed.  Constitutional:      Appearance: Normal appearance.  Cardiovascular:     Rate and Rhythm: Normal rate and regular rhythm.  Pulmonary:     Effort: Pulmonary effort is normal.     Breath sounds: Normal breath sounds.  Musculoskeletal:        General: Normal range of motion.  Skin:    General:  Skin is warm and dry.     Findings: Rash present. Rash is urticarial (hive like pink and darker colored bumps on upper & lower back, abdomen, bilateral arms, chest).  Neurological:     Mental Status: She is alert.  Psychiatric:        Mood and Affect: Mood normal.        Behavior: Behavior normal.    BP 105/69 (BP Location: Left Arm, Patient Position: Sitting, Cuff Size: Normal)   Pulse 79   Temp 98.3 F (36.8 C) (Temporal)   Ht 5\' 6"  (1.676 m)   Wt 120 lb 9.6 oz (54.7 kg)   LMP 02/24/2022 (Approximate)   SpO2 97%   BMI 19.47 kg/m  Wt Readings from Last 3 Encounters:  03/11/22 120 lb 9.6 oz (54.7 kg)  12/03/21 121 lb 9.6 oz (55.2 kg)  03/03/21 130 lb (59 kg)        03/05/21, NP

## 2022-03-25 ENCOUNTER — Other Ambulatory Visit: Payer: Self-pay | Admitting: Family Medicine

## 2022-03-26 MED ORDER — VITAMIN D (ERGOCALCIFEROL) 1.25 MG (50000 UNIT) PO CAPS: 50000.0000 [IU] | ORAL_CAPSULE | ORAL | 0 refills | Status: AC

## 2023-09-23 ENCOUNTER — Encounter: Payer: Self-pay | Admitting: Student in an Organized Health Care Education/Training Program

## 2023-09-23 ENCOUNTER — Ambulatory Visit (INDEPENDENT_AMBULATORY_CARE_PROVIDER_SITE_OTHER): Admitting: Student in an Organized Health Care Education/Training Program

## 2023-09-23 VITALS — BP 120/76 | HR 98 | Temp 98.8°F | Wt 131.0 lb

## 2023-09-23 DIAGNOSIS — J029 Acute pharyngitis, unspecified: Secondary | ICD-10-CM | POA: Diagnosis not present

## 2023-09-23 DIAGNOSIS — J019 Acute sinusitis, unspecified: Secondary | ICD-10-CM | POA: Insufficient documentation

## 2023-09-23 LAB — POCT RAPID STREP A (OFFICE): Rapid Strep A Screen: NEGATIVE

## 2023-09-23 NOTE — Progress Notes (Signed)
   Acute Office Visit  Subjective:     Patient ID: Caroline Grant, female    DOB: 17-Aug-1995, 28 y.o.   MRN: [id number]  Chief Complaint  Patient presents with   Cough    Cough, runny nose, yellow-green mucus, sore throat started 2 weeks ago went away but seems ot be back. No fevers that patient is aware of and no OTC medications. Patient states she is pregnant as well.     HPI  Patient is in today for viral upper respiratory tract infection.  Patient reports feeling poorly since Saturday, about 4 days ago.  Right now she is feeling moderate pharyngitis, has significant nasal drainage.  Denies any fevers.  No sinus pressure or pain.  She has a moderate nonproductive cough.  No chest pain or dyspnea with exertion.  Patient is about [redacted] weeks pregnant.  Eating and drinking well.  No lightheadedness.  She works 2 jobs at [employer] and at [employer].  Saving up money to afford a car.  Her father is also sick around the same time with a viral upper respiratory infection.       Objective:    BP 120/76   Pulse 98   Temp 98.8 F (37.1 C)   Wt 131 lb (59.4 kg)   SpO2 99%   BMI 21.14 kg/m    Physical Exam  Gen: Tired appearing woman Neck: No adenopathy Mouth: Moderately inflamed posterior oropharynx with enlarged tonsils bilaterally, no exudate CV: Regular, no murmur Lungs: Unlabored, clear throughout Ext: No lower extremity edema     Assessment & Plan:   Problem List Items Addressed This Visit       Unprioritized   Acute sinusitis   Acute issue.  Symptoms suggest acute viral upper respiratory tract infection.  Inflammation mostly in the sinuses.  She had some pharyngitis as well but rapid strep test negative.  We talked about the nature of acute sinusitis most likely being viral.  We talked about supportive care with sinus irrigation and intranasal steroids.  We decided not to use antibiotics unless symptoms progress.  I think there is a high chance that this will be  self-limited and she will be feeling better in the coming days.      Other Visit Diagnoses       Sore throat    -  Primary   Relevant Orders   POCT rapid strep A (Completed)        No follow-ups on file.  Tyson Alias, MD

## 2023-09-23 NOTE — Patient Instructions (Signed)
 Try a product called arm and Hammer simply saline nasal spray.  Spray this product into each nostril until it starts to drain out of the opposite nostril.  Blow your nose.  2 times a day.  Can also use Flonase sinus spray

## 2023-09-23 NOTE — Assessment & Plan Note (Signed)
 Acute issue.  Symptoms suggest acute viral upper respiratory tract infection.  Inflammation mostly in the sinuses.  She had some pharyngitis as well but rapid strep test negative.  We talked about the nature of acute sinusitis most likely being viral.  We talked about supportive care with sinus irrigation and intranasal steroids.  We decided not to use antibiotics unless symptoms progress.  I think there is a high chance that this will be self-limited and she will be feeling better in the coming days.

## 2023-10-21 ENCOUNTER — Encounter: Payer: Self-pay | Admitting: Student in an Organized Health Care Education/Training Program

## 2023-10-21 ENCOUNTER — Ambulatory Visit: Admitting: Student in an Organized Health Care Education/Training Program

## 2023-10-21 VITALS — BP 100/58 | HR 88 | Temp 98.7°F | Wt 129.0 lb

## 2023-10-21 DIAGNOSIS — Z1152 Encounter for screening for COVID-19: Secondary | ICD-10-CM

## 2023-10-21 DIAGNOSIS — J029 Acute pharyngitis, unspecified: Secondary | ICD-10-CM | POA: Diagnosis not present

## 2023-10-21 DIAGNOSIS — J020 Streptococcal pharyngitis: Secondary | ICD-10-CM

## 2023-10-21 LAB — POC INFLUENZA A&B (BINAX/QUICKVUE)
Influenza A, POC: NEGATIVE
Influenza B, POC: NEGATIVE

## 2023-10-21 LAB — POC COVID19 BINAXNOW: SARS Coronavirus 2 Ag: NEGATIVE

## 2023-10-21 LAB — POCT RAPID STREP A (OFFICE): Rapid Strep A Screen: POSITIVE — AB

## 2023-10-21 MED ORDER — AMOXICILLIN 500 MG PO TABS: 500.0000 mg | ORAL_TABLET | Freq: Two times a day (BID) | ORAL | 0 refills | Status: AC

## 2023-10-21 NOTE — Progress Notes (Signed)
   Acute Office Visit  Subjective:     Patient ID: Caroline Grant, female    DOB: 03-18-1996, 28 y.o.   MRN: [id number]  Chief Complaint  Patient presents with   Sore Throat    Patient states she is having sore throat and left lymph node, having sever headaches and not being able to sleep or walk. Patient states the sore throat is also causing sever neck pain.     HPI  Patient is in today for sore throat and headache.  I saw the patient last month for an episode acute sinusitis that we treated supportively.  This improved.  She was feeling well, about 3 days ago when she started develop sore throat, increasing sinus drainage, and a right-sided headache.  No fevers at home.  She is eating and drinking well.  Does not feel like this is a migraine.  She also has tenderness on the left side of her neck.  Breathing is normal.  Very minimal cough, no sputum production.      Objective:    BP (!) 100/58   Pulse 88   Temp 98.7 F (37.1 C) (Temporal)   Wt 129 lb (58.5 kg)   SpO2 98%   BMI 20.82 kg/m    Physical Exam  Gen: Ill-appearing Ears: Normal bilateral tympanic membranes with no effusion Mouth: Moderate erythema in the posterior oropharynx with moderately swollen tonsils bilaterally Neck: Tender lymphadenitis on the left submandibular space, no erythema or swelling, normal thyroid Heart: Regular, no murmur Lungs: Unlabored, clear to auscultation throughout    Results for orders placed or performed in visit on 10/21/23  POCT rapid strep A  Result Value Ref Range   Rapid Strep A Screen Positive (A) Negative  POC COVID-19  Result Value Ref Range   SARS Coronavirus 2 Ag Negative Negative  POC Influenza A&B (Binax test)  Result Value Ref Range   Influenza A, POC Negative Negative   Influenza B, POC Negative Negative        Assessment & Plan:   Problem List Items Addressed This Visit       Unprioritized   Strep pharyngitis - Primary   Clinical course and exam  are consistent with strep pharyngitis.  She has swollen tonsils, no exudate, left-sided lymphadenitis, absent cough.  Rapid strep testing was positive.  Will treat with amoxicillin for 10 days to improve symptoms and reduce the risk of complication.  We talked about supportive care measures including Tylenol and NSAIDs.  Okay to return to work in 5 days if feeling better.      Relevant Medications   amoxicillin (AMOXIL) 500 MG tablet   Other Visit Diagnoses       Sore throat       Relevant Orders   POCT rapid strep A (Completed)   POC Influenza A&B (Binax test) (Completed)     Encounter for screening for COVID-19       Relevant Orders   POC COVID-19 (Completed)       Meds ordered this encounter  Medications   amoxicillin (AMOXIL) 500 MG tablet    Sig: Take 1 tablet (500 mg total) by mouth 2 (two) times daily for 10 days.    Dispense:  20 tablet    Refill:  0    No follow-ups on file.  Tyson Alias, MD

## 2023-10-21 NOTE — Assessment & Plan Note (Signed)
 Clinical course and exam are consistent with strep pharyngitis.  She has swollen tonsils, no exudate, left-sided lymphadenitis, absent cough.  Rapid strep testing was positive.  Will treat with amoxicillin for 10 days to improve symptoms and reduce the risk of complication.  We talked about supportive care measures including Tylenol and NSAIDs.  Okay to return to work in 5 days if feeling better.

## 2023-12-05 ENCOUNTER — Other Ambulatory Visit: Payer: Self-pay

## 2023-12-05 ENCOUNTER — Emergency Department (HOSPITAL_COMMUNITY)
Admission: EM | Admit: 2023-12-05 | Discharge: 2023-12-05 | Discharge status: Against Medical Advice | Attending: Emergency Medicine | Admitting: Emergency Medicine

## 2023-12-05 DIAGNOSIS — Z5321 Procedure and treatment not carried out due to patient leaving prior to being seen by health care provider: Secondary | ICD-10-CM | POA: Diagnosis not present

## 2023-12-05 DIAGNOSIS — M7989 Other specified soft tissue disorders: Secondary | ICD-10-CM | POA: Insufficient documentation

## 2023-12-05 NOTE — ED Triage Notes (Signed)
 Pt has ring on middle finger right hand that is now swollen from ring being too tight. Pt here to get ring cut off. Denies injury to finger. Pain 5/10 from swelling

## 2023-12-05 NOTE — ED Notes (Signed)
 Pt decided to leave while waiting for a room.

## 2024-02-09 LAB — LAB REPORT - SCANNED

## 2024-02-29 ENCOUNTER — Ambulatory Visit: Payer: Self-pay

## 2024-02-29 NOTE — Telephone Encounter (Signed)
 Appt with Dr Mahlon on 03/02/2024 for this concern

## 2024-02-29 NOTE — Telephone Encounter (Signed)
 FYI Only or Action Required?: FYI only for provider.  Patient was last seen in primary care on 10/21/2023 by Jerrell Cleatus Ned, MD.  Called Nurse Triage reporting Cough and Nasal Congestion.  Symptoms began several days ago.  Interventions attempted: OTC medications: Mucinex and Rest, hydration, or home remedies.  Symptoms are: gradually worsening.  Triage Disposition: Home Care  Patient/caregiver understands and will follow disposition?: No, refuses disposition Copied from CRM [id number]. Topic: Clinical - Red Word Triage >> Feb 29, 2024  3:51 PM Suzen RAMAN wrote: Red Word that prompted transfer to Nurse Triage: headache,congestion,discolored phlegm Reason for Disposition  [1] Headache AND [2] part of viral illness AND [3] present < 72 hours  Cough  Answer Assessment - Initial Assessment Questions 1. ONSET: When did the cough begin?      2 days ago 2. SEVERITY: How bad is the cough today?      Strong 3. SPUTUM: Describe the color of your sputum (e.g., none, dry cough; clear, white, yellow, green)     Yellow and green  4. HEMOPTYSIS: Are you coughing up any blood? If Yes, ask: How much? (e.g., flecks, streaks, tablespoons, etc.)     Denies  5. DIFFICULTY BREATHING: Are you having difficulty breathing? If Yes, ask: How bad is it? (e.g., mild, moderate, severe)      Denies  6. FEVER: Do you have a fever? If Yes, ask: What is your temperature, how was it measured, and when did it start?     Denies  7. CARDIAC HISTORY: Do you have any history of heart disease? (e.g., heart attack, congestive heart failure)       8. LUNG HISTORY: Do you have any history of lung disease?  (e.g., pulmonary embolus, asthma, emphysema)      9. PE RISK FACTORS: Do you have a history of blood clots? (or: recent major surgery, recent prolonged travel, bedridden)     Denies  10. OTHER SYMPTOMS: Do you have any other symptoms? (e.g., runny nose, wheezing, chest pain)        Nasal congestion, headache 5/10  Answer Assessment - Initial Assessment Questions 1. LOCATION: Where does it hurt?      Generalized 2. ONSET: When did the headache start? (e.g., minutes, hours, days)      2 days ago with uri sx 3. PATTERN: Does the pain come and go, or has it been constant since it started?     constant 4. SEVERITY: How bad is the pain? and What does it keep you from doing?  (e.g., Scale 1-10; mild, moderate, or severe)     5/10 5. RECURRENT SYMPTOM: Have you ever had headaches before? If Yes, ask: When was the last time? and What happened that time?       6. CAUSE: What do you think is causing the headache?     Cold 7. MIGRAINE: Have you been diagnosed with migraine headaches? If Yes, ask: Is this headache similar?       8. HEAD INURY: Has there been any recent injury to your head?      no 9. OTHER SYMPTOMS: Do you have any other symptoms? (e.g., fever, stiff neck, eye pain, sore throat, cold symptoms)     Congestion, cough  Additional info: Patient requesting acute visit to evaluate and treat upper respiratory sx.  Protocols used: Cough - Acute Productive-A-AH, Headache-A-AH

## 2024-03-02 ENCOUNTER — Ambulatory Visit: Admitting: Family Medicine

## 2024-03-12 ENCOUNTER — Other Ambulatory Visit: Payer: Self-pay

## 2024-03-12 ENCOUNTER — Emergency Department (HOSPITAL_COMMUNITY)
Admission: EM | Admit: 2024-03-12 | Discharge: 2024-03-12 | Disposition: A | Discharge status: Home / Self Care | Attending: Emergency Medicine | Admitting: Emergency Medicine

## 2024-03-12 ENCOUNTER — Emergency Department (HOSPITAL_COMMUNITY)

## 2024-03-12 ENCOUNTER — Encounter (HOSPITAL_COMMUNITY): Payer: Self-pay

## 2024-03-12 DIAGNOSIS — D72829 Elevated white blood cell count, unspecified: Secondary | ICD-10-CM | POA: Diagnosis not present

## 2024-03-12 DIAGNOSIS — O039 Complete or unspecified spontaneous abortion without complication: Secondary | ICD-10-CM | POA: Diagnosis present

## 2024-03-12 DIAGNOSIS — D75839 Thrombocytosis, unspecified: Secondary | ICD-10-CM | POA: Diagnosis not present

## 2024-03-12 DIAGNOSIS — O034 Incomplete spontaneous abortion without complication: Secondary | ICD-10-CM | POA: Diagnosis not present

## 2024-03-12 LAB — COMPREHENSIVE METABOLIC PANEL WITH GFR
ALT: 7 U/L (ref 0–44)
AST: 18 U/L (ref 15–41)
Albumin: 4.1 g/dL (ref 3.5–5.0)
Alkaline Phosphatase: 36 U/L — ABNORMAL LOW (ref 38–126)
Anion gap: 15 (ref 5–15)
BUN: 8 mg/dL (ref 6–20)
CO2: 20 mmol/L — ABNORMAL LOW (ref 22–32)
Calcium: 9.1 mg/dL (ref 8.9–10.3)
Chloride: 100 mmol/L (ref 98–111)
Creatinine, Ser: 0.66 mg/dL (ref 0.44–1.00)
GFR, Estimated: >60 mL/min (ref >60)
Glucose, Bld: 88 mg/dL (ref 70–99)
Potassium: 3.8 mmol/L (ref 3.5–5.1)
Sodium: 135 mmol/L (ref 135–145)
Total Bilirubin: 0.4 mg/dL (ref 0.0–1.2)
Total Protein: 6.9 g/dL (ref 6.5–8.1)

## 2024-03-12 LAB — CBC WITH DIFFERENTIAL/PLATELET
Abs Immature Granulocytes: 0.05 K/uL (ref 0.00–0.07)
Basophils Absolute: 0 K/uL (ref 0.0–0.1)
Basophils Relative: 0 %
Eosinophils Absolute: 0 K/uL (ref 0.0–0.5)
Eosinophils Relative: 0 %
HCT: 34.6 % — ABNORMAL LOW (ref 36.0–46.0)
Hemoglobin: 11.4 g/dL — ABNORMAL LOW (ref 12.0–15.0)
Immature Granulocytes: 0 %
Lymphocytes Relative: 9 %
Lymphs Abs: 1.3 K/uL (ref 0.7–4.0)
MCH: 28.9 pg (ref 26.0–34.0)
MCHC: 32.9 g/dL (ref 30.0–36.0)
MCV: 87.8 fL (ref 80.0–100.0)
Monocytes Absolute: 1.6 K/uL — ABNORMAL HIGH (ref 0.1–1.0)
Monocytes Relative: 12 %
Neutro Abs: 10.5 K/uL — ABNORMAL HIGH (ref 1.7–7.7)
Neutrophils Relative %: 79 %
Platelets: 404 K/uL — ABNORMAL HIGH (ref 150–400)
RBC: 3.94 MIL/uL (ref 3.87–5.11)
RDW: 14.6 % (ref 11.5–15.5)
WBC: 13.5 K/uL — ABNORMAL HIGH (ref 4.0–10.5)
nRBC: 0 % (ref 0.0–0.2)

## 2024-03-12 LAB — TYPE AND SCREEN
ABO/RH(D): A POS
Antibody Screen: NEGATIVE

## 2024-03-12 LAB — HEMOGLOBIN AND HEMATOCRIT, BLOOD
HCT: 33 % — ABNORMAL LOW (ref 36.0–46.0)
Hemoglobin: 11.1 g/dL — ABNORMAL LOW (ref 12.0–15.0)

## 2024-03-12 LAB — ABO/RH: ABO/RH(D): A POS

## 2024-03-12 LAB — HCG, QUANTITATIVE, PREGNANCY: hCG, Beta Chain, Quant, S: 6602 m[IU]/mL — ABNORMAL HIGH (ref <5)

## 2024-03-12 MED ORDER — LACTATED RINGERS IV BOLUS
1000.0000 mL | Freq: Once | INTRAVENOUS | Status: AC
  Administered 2024-03-12: 1000 mL via INTRAVENOUS

## 2024-03-12 MED ORDER — MISOPROSTOL 200 MCG PO TABS
800.0000 ug | ORAL_TABLET | Freq: Once | ORAL | Status: AC
  Administered 2024-03-12: 800 ug via ORAL
  Filled 2024-03-12: qty 4

## 2024-03-12 MED ORDER — MISOPROSTOL 200 MCG PO TABS: 400.0000 ug | ORAL_TABLET | Freq: Three times a day (TID) | ORAL | 0 refills | Status: DC
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
## 2024-03-12 NOTE — ED Triage Notes (Signed)
 Patient said she began having pelvic cramps yesterday. Is [redacted] weeks pregnant. Went to the bathroom and pushed a sac out. Has had a miscarriage before. Is bleeding vaginally right now. Has changed her pad twice.

## 2024-03-12 NOTE — Discharge Instructions (Addendum)
 Charis Karry  Thank you for allowing us  to take care of you today.  You came to the Emergency Department today because you had a second trimester miscarriage.  You did still have some blood and placenta in your uterus, therefore we are going to start you on a medication called Cytotec .  You will take this 3 times a day for the next 3 days after the first dose here in the emergency department.  Please come back to the emergency department if you have worsening pain, bleeding, elevation of heart rate, dizziness, chest pain, or if you have any other reasonably that you need emergency care.  We will refer you to follow-up with OB/GYN for follow-up of this minor miscarriage as well as potential workup for repeated miscarriages.  To-Do: 1. Please follow-up with your primary doctor within 1 week/ as soon as possible.   Please return to the Emergency Department or call 911 if you experience have worsening of your symptoms, or do not get better, chest pain, shortness of breath, severe or significantly worsening pain, high fever, severe confusion, pass out or have any reason to think that you need emergency medical care.   We hope you feel better soon.   Mitzie Later, MD Department of Emergency Medicine [facility]

## 2024-03-14 ENCOUNTER — Telehealth (HOSPITAL_COMMUNITY): Payer: Self-pay | Admitting: Emergency Medicine

## 2024-03-14 NOTE — Telephone Encounter (Signed)
 Orders only encounter, patient recently seen in the emergency department, required referral to OB/GYN which was mistakenly not placed at the time of discharge from the ED.

## 2024-07-15 ENCOUNTER — Ambulatory Visit (HOSPITAL_COMMUNITY)
Admission: RE | Admit: 2024-07-15 | Discharge: 2024-07-15 | Disposition: A | Discharge status: Home / Self Care | Payer: Self-pay | Source: Ambulatory Visit | Attending: Nurse Practitioner | Admitting: Nurse Practitioner

## 2024-07-15 ENCOUNTER — Encounter (HOSPITAL_COMMUNITY): Payer: Self-pay

## 2024-07-15 ENCOUNTER — Ambulatory Visit (INDEPENDENT_AMBULATORY_CARE_PROVIDER_SITE_OTHER): Payer: Self-pay

## 2024-07-15 ENCOUNTER — Other Ambulatory Visit: Payer: Self-pay

## 2024-07-15 VITALS — BP 110/75 | HR 102 | Temp 98.2°F | Resp 18

## 2024-07-15 DIAGNOSIS — M25562 Pain in left knee: Secondary | ICD-10-CM

## 2024-07-15 MED ORDER — IBUPROFEN 800 MG PO TABS: 800.0000 mg | ORAL_TABLET | Freq: Three times a day (TID) | ORAL | 0 refills | Status: AC | PRN
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
